# Patient Record
Sex: Male | Born: 1950 | Race: White | Hispanic: No | Marital: Married
Health system: Midwestern US, Academic
[De-identification: ages and names within clinical notes are randomized; demographics above are authoritative.]

---

## 2021-05-12 ENCOUNTER — Encounter: Admit: 2021-05-12 | Discharge: 2021-05-12 | Payer: MEDICARE

## 2021-05-12 NOTE — Telephone Encounter
05-12-2021 Per Task Message, requests faxed to Dr Wilford Grist, (F) [phone], (P) [phone], and [facility], (F) [phone], (P) [phone], requested any Echo Images be Clouded or CD sent to [facility], [address], [address]      Please request records from PCP, Dr. Wilford Grist - Ph: [phone]; Fax: [phone] and cardiac testing from [facility] - Ph: [phone].

## 2021-05-13 ENCOUNTER — Encounter: Admit: 2021-05-13 | Discharge: 2021-05-13 | Payer: MEDICARE

## 2021-05-18 ENCOUNTER — Encounter: Admit: 2021-05-18 | Discharge: 2021-05-18 | Payer: MEDICARE

## 2021-05-19 ENCOUNTER — Encounter: Admit: 2021-05-19 | Discharge: 2021-05-19 | Payer: MEDICARE

## 2021-05-19 ENCOUNTER — Ambulatory Visit: Admit: 2021-05-19 | Discharge: 2021-05-20 | Payer: MEDICARE

## 2021-05-19 DIAGNOSIS — E7800 Pure hypercholesterolemia, unspecified: Secondary | ICD-10-CM

## 2021-05-19 DIAGNOSIS — E079 Disorder of thyroid, unspecified: Secondary | ICD-10-CM

## 2021-05-19 DIAGNOSIS — I351 Nonrheumatic aortic (valve) insufficiency: Secondary | ICD-10-CM

## 2021-05-19 DIAGNOSIS — K219 Gastro-esophageal reflux disease without esophagitis: Secondary | ICD-10-CM

## 2021-05-19 DIAGNOSIS — R011 Cardiac murmur, unspecified: Secondary | ICD-10-CM

## 2021-05-19 DIAGNOSIS — B999 Unspecified infectious disease: Secondary | ICD-10-CM

## 2021-05-19 DIAGNOSIS — G4730 Sleep apnea, unspecified: Secondary | ICD-10-CM

## 2021-05-19 DIAGNOSIS — M1990 Unspecified osteoarthritis, unspecified site: Secondary | ICD-10-CM

## 2021-05-19 DIAGNOSIS — I10 Essential (primary) hypertension: Secondary | ICD-10-CM

## 2021-05-19 DIAGNOSIS — D539 Nutritional anemia, unspecified: Secondary | ICD-10-CM

## 2021-05-19 DIAGNOSIS — E785 Hyperlipidemia, unspecified: Secondary | ICD-10-CM

## 2021-05-19 NOTE — Progress Notes
Date of Service: 05/19/2021    Justin Chang is a 71 y.o. male.       HPI     Patient is a 71 year old Caucasian male past medical history of obesity, Barrett's esophagus, BPH, obstructive sleep apnea on chronic CPAP therapy who was having issues with shortness of breath last fall and increased activity intolerance which led to a nuclear cardiac stress test that was normal.  In follow-up with his primary physician this spring a soft murmur was identified.  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  The report notes that the valve is tricuspid with no findings for stenosis, there is mild dilatation of the ascending aorta.  The pressure half-time at the aortic valve was greater than 500 ms.  I do not see report specific to a vena contractor measurement of the aortic valve regurgitation or mention as far as reversal of flow identified in the aorta.  Patient denies significant chest pressures and overall shortness of breath has been stable.  Reports he will get somewhat lightheaded periodically but nothing has been problematic or unexpected and no syncope.         Vitals:    05/19/21 1039   BP: 120/76   BP Source: Arm, Left Upper   Pulse: 80   SpO2: 95%   O2 Percent: 95 %   O2 Device: None (Room air)   PainSc: Zero   Weight: 90.2 kg (198 lb 12.8 oz)   Height: 165.1 cm (5' 5)     Body mass index is 33.08 kg/m?Marland Kitchen     Past Medical History  Patient Active Problem List    Diagnosis Date Noted   ? Anemia 05/18/2021   ? Barrett's esophagus determined by endoscopy 05/18/2021   ? BPH (benign prostatic hyperplasia) 05/18/2021   ? Gastric reflux 05/18/2021   ? Hyperlipidemia 05/18/2021   ? Hypertension 05/18/2021   ? Chronic obstructive pulmonary disease (HCC) 05/18/2021   ? Anxiety disorder 05/18/2021   ? Under observation for suspected coronary artery disease 05/18/2021     04/18/21 Echo:  Normal LV systolic and diastolic function.  Moderate aortic valve insufficiency.  Mildly dilated ascending aorta.    12/02/20  Stress MPI:  Non-diagnostic pharmacological stress ECG.  Normal LV systolic function.  Normal perfusion without evidence of ischemia.  Low risk Study  .  10/18/20 Coronary calcium score:  Total 24.7, Lt main 4.3, LAD 0, Lt Circ 0, RCA 20.4,            Review of Systems   Constitutional: Positive for malaise/fatigue.   HENT: Negative.    Eyes: Negative.    Cardiovascular: Positive for dyspnea on exertion.   Respiratory: Positive for shortness of breath.    Endocrine: Negative.    Hematologic/Lymphatic: Negative.    Skin: Negative.    Musculoskeletal: Positive for back pain, myalgias and neck pain.   Gastrointestinal: Negative.    Genitourinary: Negative.    Neurological: Positive for dizziness, headaches, light-headedness and weakness.   Psychiatric/Behavioral: Negative.    Allergic/Immunologic: Negative.        Physical Exam  Awake and alert, pleasant gentleman, large neck circumference with loss of landmarks and centrally obese.  Accompanied by his wife  Pupils are equal react without scleral injection  Neck is supple with normal carotid upstroke and no obvious masses.  Difficult to determine landmarks.  No obvious jugular venous distention or variable CV waves  Lungs are clear to  auscultation with good effort and symmetric rise and fall the chest  Heart S1, S2 that are normal.  I can create some S2 splitting with respiration, but do not appreciate significant murmur, click, or gallop  Abdomen is obese and protuberant, nontender.  Unable to appreciate the aortic impulse  Pulses are 2+ bilaterally at radial locations as well as at the pedal locations.  Normal refill with no pulsatile changes at the nailbeds  No peripheral edema with normal hair growth at the ankles and lower legs.  Symmetric muscle tone and skin turgor    Cardiovascular Studies  ECG from 2022 shows sinus rhythm with normal conduction repolarization  I reviewed nuclear stress test from December 2022 that showed preserved left ventricular ejection fraction and no ischemia  Echocardiogram performed in 2023 per described of above    Cardiovascular Health Factors  Vitals BP Readings from Last 3 Encounters:   05/19/21 120/76     Wt Readings from Last 3 Encounters:   05/19/21 90.2 kg (198 lb 12.8 oz)     BMI Readings from Last 3 Encounters:   05/19/21 33.08 kg/m?      Smoking Social History     Tobacco Use   Smoking Status Former   ? Types: Cigars   Smokeless Tobacco Former   ? Types: Chew   Tobacco Comments    Quit a long time ago      Lipid Profile No results found for: CHOL  No results found for: HDL  No results found for: LDL  No results found for: TRIG   Blood Sugar No results found for: HGBA1C  Glucose   Date Value Ref Range Status   09/15/2020 92  Final          Problems Addressed Today  Encounter Diagnoses   Name Primary?   ? Nonrheumatic aortic valve insufficiency Yes       Assessment and Plan     At this time patient is not having symptoms or for findings for significant aortic valve insufficiency.  They report identifies it is moderate.  With the available information of the pressure half-time of greater than 500 ms, he does not have widening of his pole fracture on the blood pressure measurements and preserved left ventricular ejection fraction with normal volume and size measurements I suspect it is likely more mild.  Based upon these findings would not order further testing at this time.  Would reassess in approximate 1 year to 18 months with repeat echocardiogram.  I discussed findings and my assessment and plan with the patient and his wife.  They are agreeable.         Current Medications (including today's revisions)  ? albuterol 0.083% (PROVENTIL) 2.5 mg /3 mL (0.083 %) nebulizer solution INHALE the contents of one vial in NEBULIZER EVERY 6 HOURS AS NEEDED for SHORTNESS OF BREATH or wheezing   ? aspirin EC 81 mg tablet Take one tablet by mouth daily. Take with food.   ? atorvastatin (LIPITOR) 20 mg tablet Take one tablet by mouth daily.   ? cyanocobalamin (vitamin B-12) (VITAMIN B-12) 1,000 mcg tablet Take one tablet by mouth daily.   ? cyclobenzaprine (FLEXERIL) 10 mg tablet Take one tablet by mouth three times daily as needed.   ? fluticasone propionate (FLONASE ALLERGY RELIEF) 50 mcg/actuation nasal spray, suspension Apply two sprays to each nostril as directed as Needed.   ? gabapentin (NEURONTIN) 300 mg capsule Take one capsule by mouth twice daily.   ? HYDROcodone/acetaminophen (  NORCO) 10/325 mg tablet Take one tablet by mouth every 6 hours as needed.   ? levothyroxine (SYNTHROID) 100 mcg tablet Take one tablet by mouth daily.   ? lisinopriL (ZESTRIL) 20 mg tablet Take one tablet by mouth daily.   ? meloxicam (MOBIC) 15 mg tablet Take one tablet by mouth daily.   ? omeprazole DR (PRILOSEC) 20 mg capsule Take one capsule by mouth daily.   ? tamsulosin (FLOMAX) 0.4 mg capsule Take one capsule by mouth twice daily.

## 2021-05-19 NOTE — Patient Instructions
No unstable findings for your heart.  The aortic valve is formed normally and the valve regurgitation is not causing changes to your heart function  Will arrange to repeat the echo in 12-18 months to ensure the valve is stable.

## 2022-01-31 ENCOUNTER — Encounter: Admit: 2022-01-31 | Discharge: 2022-01-31 | Payer: MEDICARE

## 2022-05-10 ENCOUNTER — Encounter: Admit: 2022-05-10 | Discharge: 2022-05-10 | Payer: MEDICARE

## 2022-05-10 ENCOUNTER — Ambulatory Visit: Admit: 2022-05-10 | Discharge: 2022-05-10 | Payer: MEDICARE

## 2022-05-10 DIAGNOSIS — Z952 Presence of prosthetic heart valve: Secondary | ICD-10-CM

## 2022-05-15 ENCOUNTER — Encounter: Admit: 2022-05-15 | Discharge: 2022-05-15 | Payer: MEDICARE

## 2022-05-15 NOTE — Telephone Encounter
-----   Message from Agapito Games, RN sent at 05/15/2022  9:10 AM CDT -----    ----- Message -----  From: Levora Angel, MD  Sent: 05/15/2022   9:05 AM CDT  To: [facility] Nurse [facility] Card Team [facility]    Please let him know that his echo looks very good.  The heart function continues to stay normal.  The aortic valve does show some leak but really nothing concerning or problematic.  Unless he is having symptoms does not need to follow-up for least another 6 to 12 months.  ----- Message -----  From: Glynda Jaeger, MD  Sent: 05/10/2022   2:12 PM CDT  To: Levora Angel, MD

## 2022-05-16 ENCOUNTER — Encounter: Admit: 2022-05-16 | Discharge: 2022-05-16 | Payer: MEDICARE

## 2022-06-12 ENCOUNTER — Encounter: Admit: 2022-06-12 | Discharge: 2022-06-12 | Payer: MEDICARE

## 2022-07-13 ENCOUNTER — Encounter: Admit: 2022-07-13 | Discharge: 2022-07-13 | Payer: MEDICARE

## 2022-07-18 ENCOUNTER — Encounter: Admit: 2022-07-18 | Discharge: 2022-07-18 | Payer: MEDICARE

## 2022-07-18 DIAGNOSIS — E785 Hyperlipidemia, unspecified: Secondary | ICD-10-CM

## 2022-07-18 DIAGNOSIS — G4730 Sleep apnea, unspecified: Secondary | ICD-10-CM

## 2022-07-18 DIAGNOSIS — Z136 Encounter for screening for cardiovascular disorders: Secondary | ICD-10-CM

## 2022-07-18 DIAGNOSIS — I351 Nonrheumatic aortic (valve) insufficiency: Secondary | ICD-10-CM

## 2022-07-18 DIAGNOSIS — D539 Nutritional anemia, unspecified: Secondary | ICD-10-CM

## 2022-07-18 DIAGNOSIS — I10 Essential (primary) hypertension: Secondary | ICD-10-CM

## 2022-07-18 DIAGNOSIS — K219 Gastro-esophageal reflux disease without esophagitis: Secondary | ICD-10-CM

## 2022-07-18 DIAGNOSIS — M1990 Unspecified osteoarthritis, unspecified site: Secondary | ICD-10-CM

## 2022-07-18 DIAGNOSIS — E079 Disorder of thyroid, unspecified: Secondary | ICD-10-CM

## 2022-07-18 DIAGNOSIS — R011 Cardiac murmur, unspecified: Secondary | ICD-10-CM

## 2022-07-18 DIAGNOSIS — B999 Unspecified infectious disease: Secondary | ICD-10-CM

## 2022-07-18 DIAGNOSIS — E7800 Pure hypercholesterolemia, unspecified: Secondary | ICD-10-CM

## 2022-07-18 NOTE — Progress Notes
Date of Service: 07/18/2022    Justin Chang is a 72 y.o. male.       HPI   Mr. Verdell has been followed for hypertension, hypercholesterolemia, obstructive sleep apnea on CPAP and chronic dyspnea with exertion.  He reports that he has been stable over the past year without any worsening of his chronic dyspnea with exertion.  He believes that he can walk 1/2-3/4 of a mile at a time without difficulty.  He reports no recent emergency room visits or hospitalizations. The patient believes that he has been stable and reports no angina, congestive symptoms, palpitations, sensation of sustained forceful heart pounding, lightheadedness or syncope.  His exercise tolerance has been stable, although he does not have a regular exercise program.  He believes that his weight has been stable over the past year.  The patient reports no myalgias, claudication, bleeding abnormalities, or strokelike symptoms.  He believes that his blood pressure is generally well-controlled when checked outside the office and works with his primary care for control of his blood pressure and lipid profile.       Vitals:    07/18/22 0944   BP: (!) 153/90   BP Source: Arm, Left Upper   Pulse: 68   SpO2: 96%   O2 Device: None (Room air)   PainSc: Four   Weight: 92.7 kg (204 lb 6.4 oz)   Height: 167.6 cm (5' 6)     Body mass index is 32.99 kg/m?Marland Kitchen     Past Medical History  Patient Active Problem List    Diagnosis Date Noted    Anemia 05/18/2021    Barrett's esophagus determined by endoscopy 05/18/2021    BPH (benign prostatic hyperplasia) 05/18/2021    Gastric reflux 05/18/2021    Hyperlipidemia 05/18/2021    Hypertension 05/18/2021    Chronic obstructive pulmonary disease (HCC) 05/18/2021    Anxiety disorder 05/18/2021    Under observation for suspected coronary artery disease 05/18/2021     04/18/21 Echo:  Normal LV systolic and diastolic function.  Moderate aortic valve insufficiency.  Mildly dilated ascending aorta.    12/02/20  Stress MPI: Non-diagnostic pharmacological stress ECG.  Normal LV systolic function.  Normal perfusion without evidence of ischemia.  Low risk Study  .  10/18/20 Coronary calcium score:  Total 24.7, Lt main 4.3, LAD 0, Lt Circ 0, RCA 20.4,            Review of Systems   Constitutional: Negative.   HENT: Negative.     Eyes: Negative.    Cardiovascular: Negative.    Respiratory: Negative.     Endocrine: Negative.    Hematologic/Lymphatic: Negative.    Skin: Negative.    Musculoskeletal: Negative.    Gastrointestinal: Negative.    Genitourinary: Negative.    Neurological: Negative.    Psychiatric/Behavioral: Negative.     Allergic/Immunologic: Negative.        Physical Exam  GENERAL: The patient is well developed, well nourished, resting comfortably and in no distress.   HEENT: No abnormalities of the visible oro-nasopharynx, conjunctiva or sclera are noted.  NECK: There is no jugular venous distension. Carotids are palpable and without bruits. There is no thyroid enlargement.  Chest: Lung fields are clear to auscultation. There are no wheezes or crackles.  CV: There is a regular rhythm. The first and second heart sounds are normal.  A soft systolic ejection murmur is heard along with a soft blowing early diastolic murmur.  There are no gallops or rubs.  ABD: The abdomen is soft and supple with normal bowel sounds. There is no hepatosplenomegaly, ascites, tenderness, masses or bruits.  Neuro: There are no focal motor defects. Ambulation is normal. Cognitive function appears normal.  Ext: There is no edema or evidence of deep vein thrombosis. Peripheral pulses are satisfactory.    SKIN: There are no rashes and no cellulitis  PSYCH: The patient is calm, rationale and oriented.    Cardiovascular Studies  A twelve-lead ECG was obtained on 07/18/2022 reveals normal sinus rhythm with a heart rate of 62 bpm.  Incomplete right bundle branch block is noted.  Echo Doppler 05/10/2022:  Interpretation Summary  Show Result Comparison     The left ventricular size, wall thickness and systolic function are normal. The ejection fraction by Simpson's biplane method is 61%. There are no segmental wall motion abnormalities.    The right ventricular size is normal. The right ventricular systolic function is normal.    Left Atrium: Mildly dilated.    Aortic Valve: The valve has focal thickening. Tricuspid. No stenosis. Mild to moderate regurgitation.    Estimated Peak Systolic PA Pressure 28 mmHg    No pericardial effusion.     See remainder of report for additional findings.        Echocardiographic Findings    Left Ventricle The left ventricular size, wall thickness and systolic function are normal. The ejection fraction by Simpson's biplane method is 61%. There are no segmental wall motion abnormalities. Grade I (mild) left ventricular diastolic dysfunction. Normal left atrial pressure.   Right Ventricle The right ventricular size is normal. The right ventricular systolic function is normal.   Left Atrium Mildly dilated.   Right Atrium Normal size.   IVC/SVC Normal central venous pressure (0-5 mm Hg).   Mitral Valve Non-specific thickening. No stenosis. No regurgitation.   Tricuspid Valve Normal valve structure. No stenosis. Trace regurgitation.   Aortic Valve The valve has focal thickening. Tricuspid.  No stenosis. Mild to moderate regurgitation.   Pulmonary The pulmonic valve was not seen well but no Doppler evidence of stenosis. Trace regurgitation.   Aorta The aortic root and ascending aorta are normal in size for gender/BSA.   Pericardium Pericardial fat pad present. No pericardial effusion.     Cardiovascular Health Factors  Vitals BP Readings from Last 3 Encounters:   07/18/22 (!) 153/90   05/10/22 122/60   05/19/21 120/76     Wt Readings from Last 3 Encounters:   07/18/22 92.7 kg (204 lb 6.4 oz)   05/10/22 93 kg (205 lb 0.4 oz)   05/19/21 90.2 kg (198 lb 12.8 oz)     BMI Readings from Last 3 Encounters:   07/18/22 32.99 kg/m?   05/10/22 33.09 kg/m? 05/19/21 33.08 kg/m?      Smoking Social History     Tobacco Use   Smoking Status Former    Types: Cigars   Smokeless Tobacco Former    Types: Chew   Tobacco Comments    Quit a long time ago      Lipid Profile No results found for: CHOL  No results found for: HDL  No results found for: LDL  No results found for: TRIG   Blood Sugar No results found for: HGBA1C  Glucose   Date Value Ref Range Status   09/15/2020 92  Final          Problems Addressed Today  Encounter Diagnoses   Name Primary?    Screening for heart disease Yes  Nonrheumatic aortic valve insufficiency        Assessment and Plan   Mr. Blackwelder reports no angina or congestive symptoms.  I have asked him to work closely with his primary care physician for control of his blood pressure and lipid profile.  I reviewed his most recent echo Doppler study with him.  Cardiovascular risk factor management was discussed in great detail. Regular mild aerobic exercise, weight loss and adherence to a heart healthy diet were recommended.  I have asked him to return for follow-up in approximately 1 years time. The total time spent during this interview and exam with preparation and chart review was 30 minutes.         Current Medications (including today's revisions)   albuterol 0.083% (PROVENTIL) 2.5 mg /3 mL (0.083 %) nebulizer solution INHALE the contents of one vial in NEBULIZER EVERY 6 HOURS AS NEEDED for SHORTNESS OF BREATH or wheezing    aspirin EC 81 mg tablet Take one tablet by mouth daily. Take with food.    atorvastatin (LIPITOR) 20 mg tablet Take one tablet by mouth daily.    cyanocobalamin (vitamin B-12) (VITAMIN B-12) 1,000 mcg tablet Take one tablet by mouth daily.    cyclobenzaprine (FLEXERIL) 10 mg tablet Take one tablet by mouth three times daily as needed.    fluticasone propionate (FLONASE ALLERGY RELIEF) 50 mcg/actuation nasal spray, suspension Apply two sprays to each nostril as directed as Needed.    gabapentin (NEURONTIN) 300 mg capsule Take one capsule by mouth twice daily.    HYDROcodone/acetaminophen (NORCO) 10/325 mg tablet Take one tablet by mouth every 6 hours as needed.    levothyroxine (SYNTHROID) 100 mcg tablet Take one tablet by mouth daily.    lisinopriL (ZESTRIL) 20 mg tablet Take one tablet by mouth daily.    meloxicam (MOBIC) 15 mg tablet Take one tablet by mouth daily.    omeprazole DR (PRILOSEC) 20 mg capsule Take one capsule by mouth daily.    tamsulosin (FLOMAX) 0.4 mg capsule Take one capsule by mouth twice daily.

## 2022-08-02 IMAGING — CT UROGRAM(Adult)
2 of 10 series · 10 of 46 positions shown, 11 images · non-contrast
Comparison: none

[Series 6: kidney 1min cor 3.00 br40 s3 · coronal · 0.24mm/px · 2 of 116 slices shown]
[im 39/116  soft-tissue]
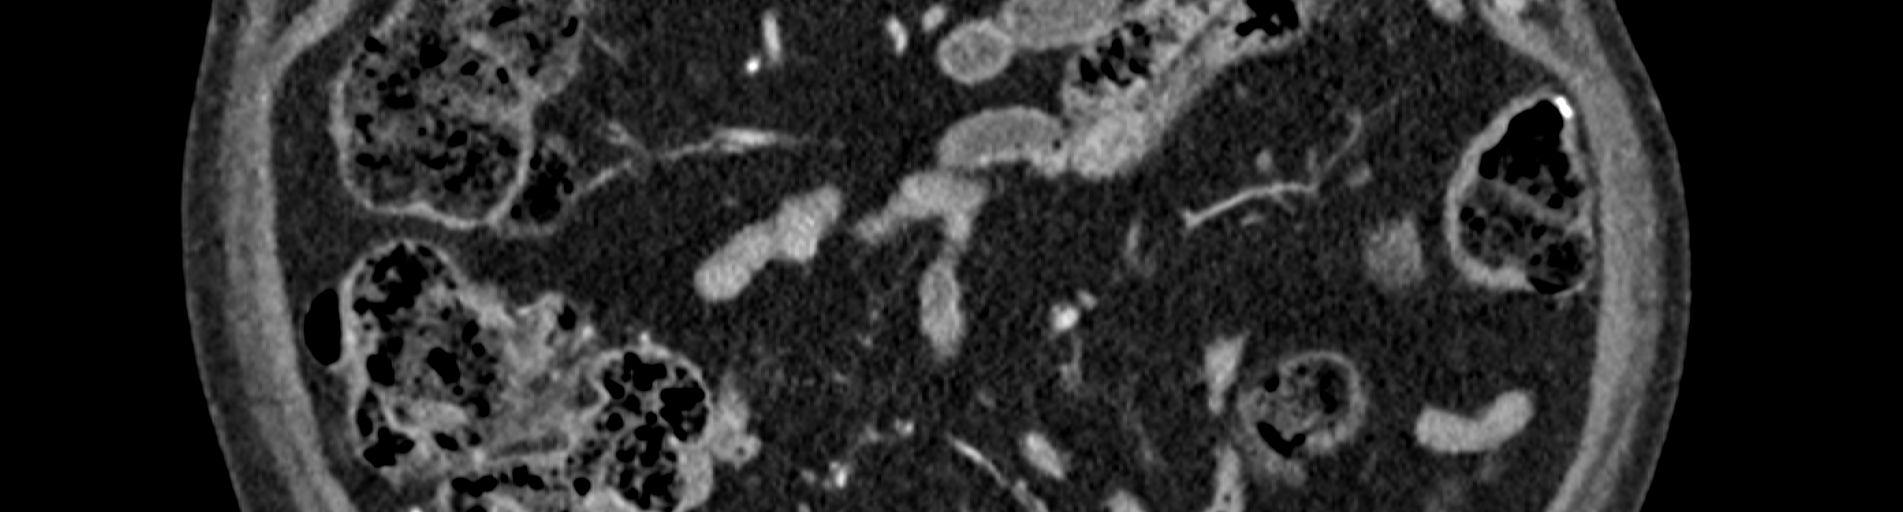
[im 77/116  soft-tissue]
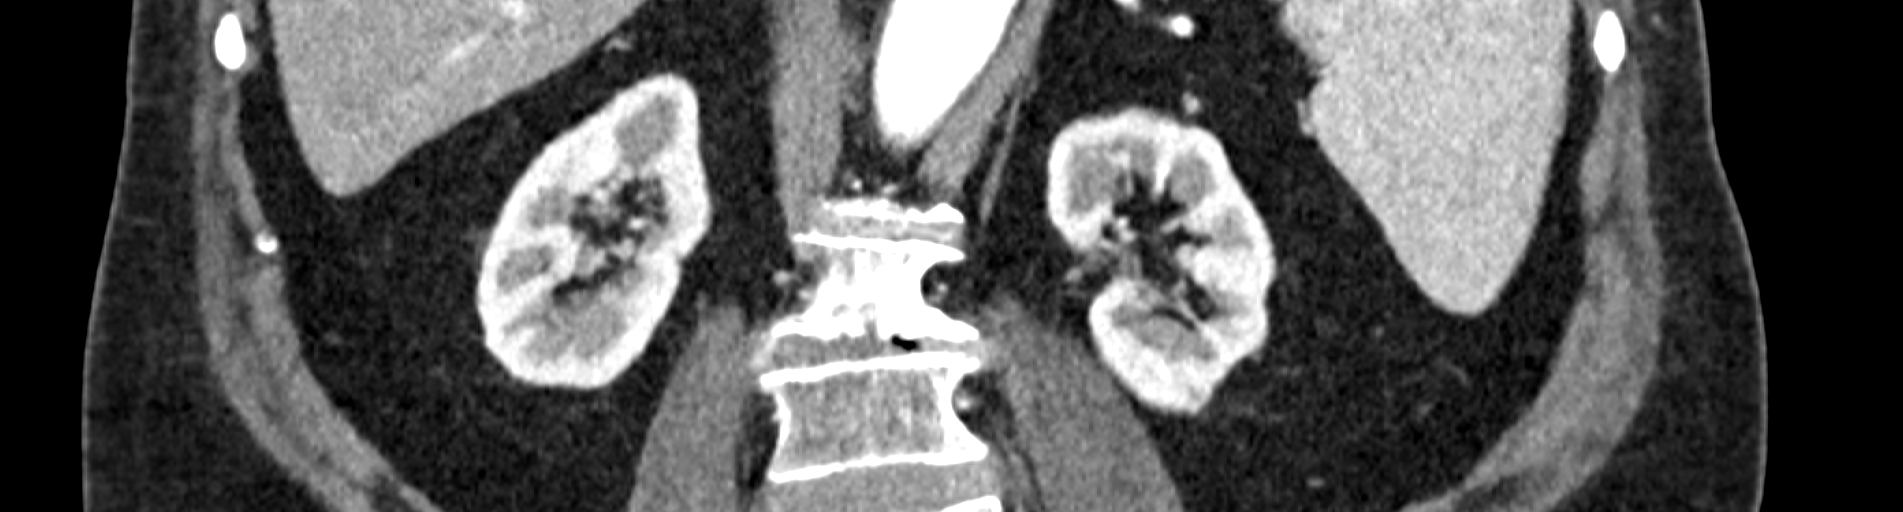

[Series 16: abd pel delay ax 3.00 br40 s3 · axial · delayed · 0.67mm/px · z∈[+1306,+1699]mm · 8 of 151 slices shown, 9 images]
[im 10/151  soft-tissue]
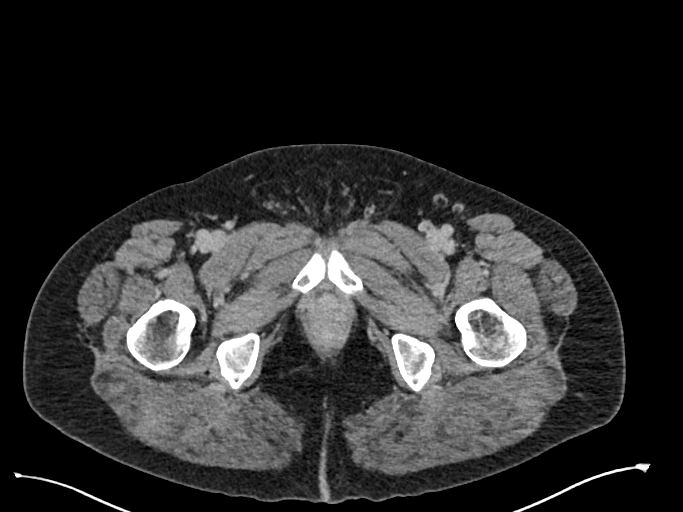
[im 10/151  bone]
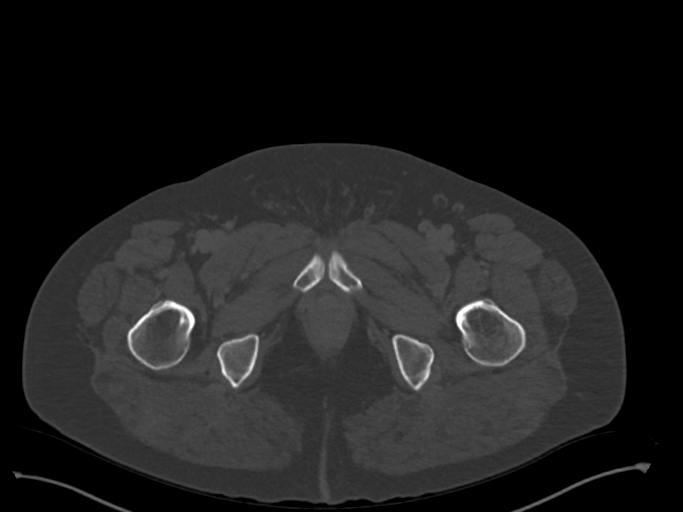
[im 29/151  soft-tissue]
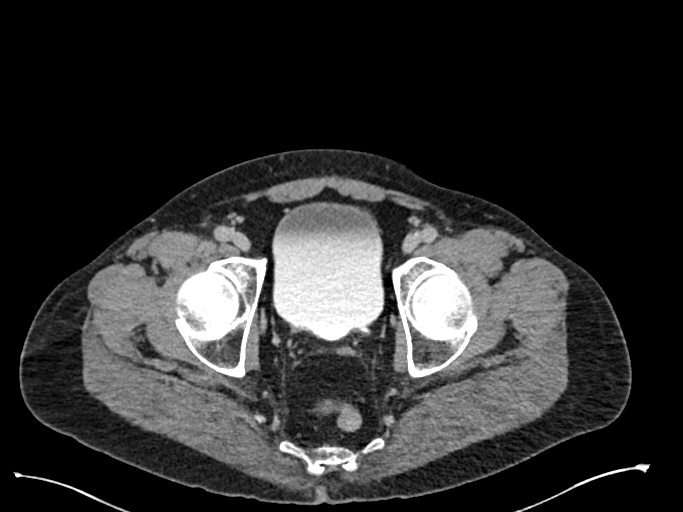
[im 47/151  soft-tissue]
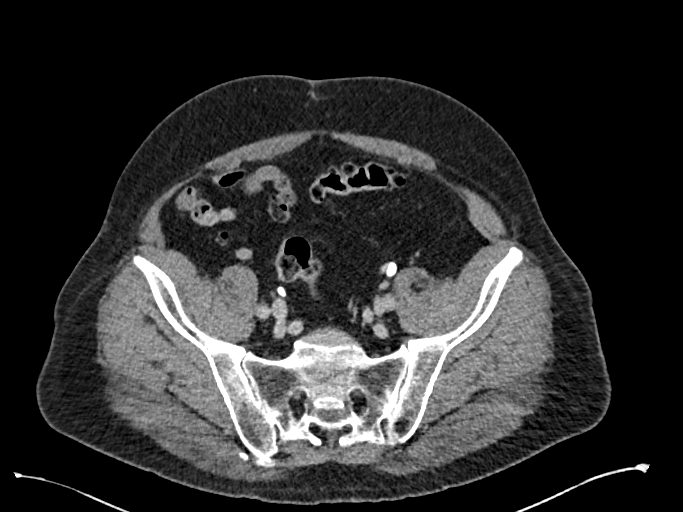
[im 66/151  soft-tissue]
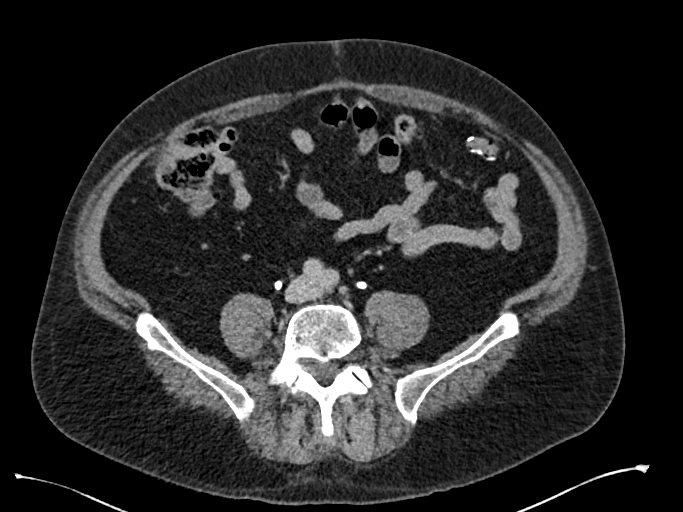
[im 85/151  soft-tissue]
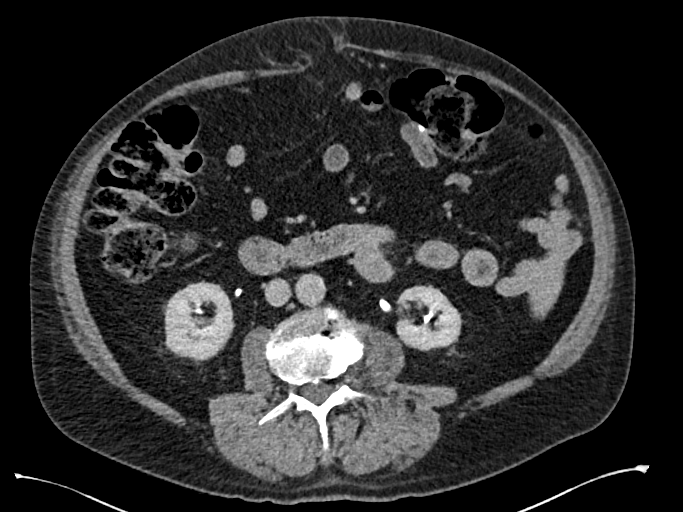
[im 104/151  soft-tissue]
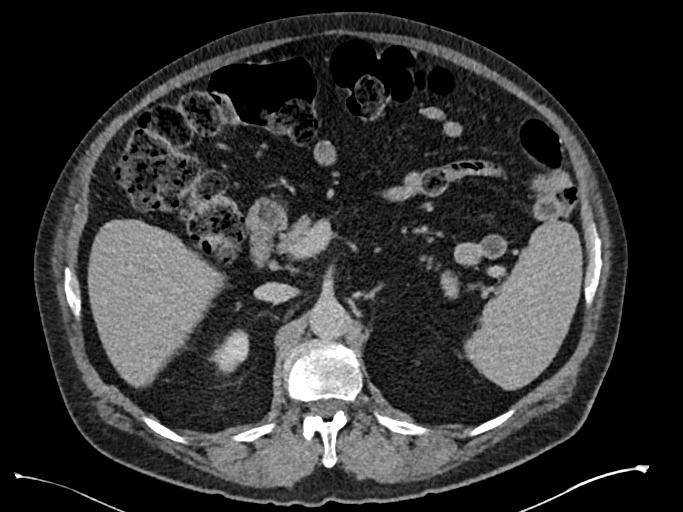
[im 122/151  soft-tissue]
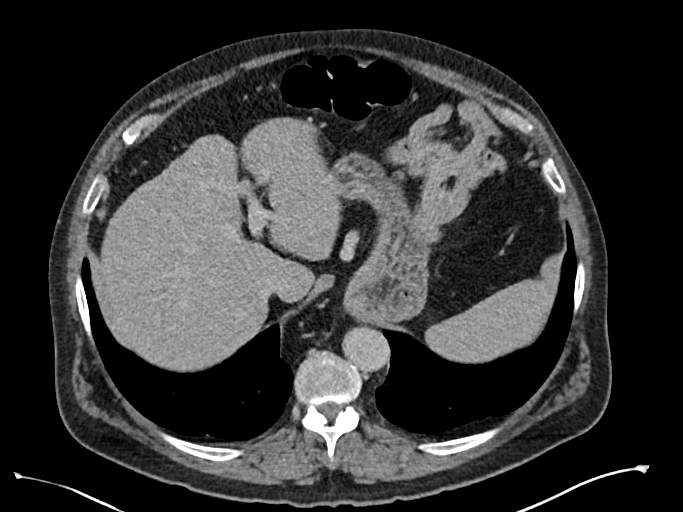
[im 141/151  soft-tissue]
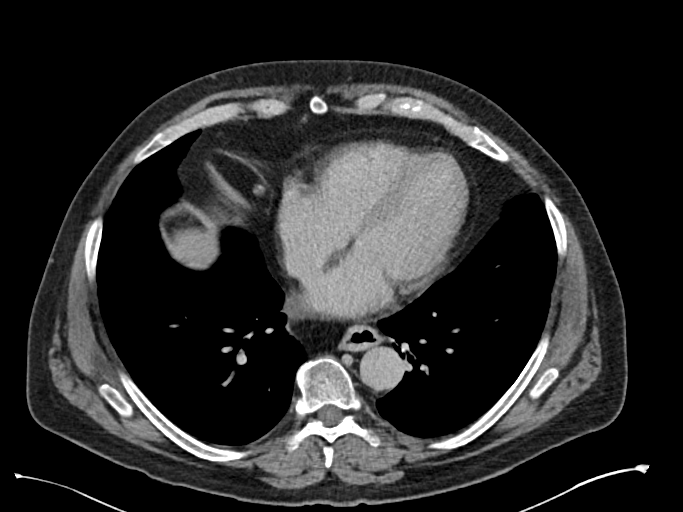

[10 of 46 positions shown; findings below may reference images not displayed]

DIAGNOSTIC STUDIES

EXAM

CT urogram

INDICATION

Hematuria
PT STATES HEMATURIA. GFR 62. FWWTX [id number]. CT/NM [DATE]. TJ

TECHNIQUE

All CT scans at this facility use dose modulation, iterative reconstruction, and/or weight based
dosing when appropriate to reduce radiation dose to as low as reasonably achievable.

Number of previous computed tomography exams in the last 12 months is 1.

Number of previous nuclear medicine myocardial perfusion studies in the last 12 months is 0  .

COMPARISONS

August 02, 2017.

FINDINGS

Lung bases are clear with mild pleural lipomatosis. Liver is unremarkable with a small granuloma in
the left hepatic lobe. No calcified gallstones are seen. Spleen is normal.

Adrenal glands are unremarkable. No renal calculi are seen. No renal mass lesions are evident. No
hydronephrosis is evident.

There is pancreatic atrophy. No retroperitoneal adenopathy. Calcified plaque is seen throughout the
abdominal pelvic vasculature.

Small and large bowel are normal in caliber. Prior anastomosis is seen left anterior abdomen. There
is mild colonic diverticulosis without diverticulitis. No ascites or mesenteric adenopathy.

No filling defects are seen in the bladder. No pelvic adenopathy is seen. Degenerative changes of
the spine are noted.

IMPRESSION

No renal calculi, hydronephrosis, or renal mass lesions. No filling defects are seen in the bladder.

Additional findings as above.

Tech Notes:

PT STATES HEMATURIA. GFR 62. FWWTX [id number]. CT/NM [DATE]. TJ

## 2022-09-28 ENCOUNTER — Encounter: Admit: 2022-09-28 | Discharge: 2022-09-28 | Payer: MEDICARE

## 2023-09-10 ENCOUNTER — Encounter: Admit: 2023-09-10 | Discharge: 2023-09-10 | Payer: MEDICARE

## 2023-09-20 ENCOUNTER — Ambulatory Visit: Admit: 2023-09-20 | Discharge: 2023-09-20 | Payer: MEDICARE

## 2023-09-20 ENCOUNTER — Encounter: Admit: 2023-09-20 | Discharge: 2023-09-20 | Payer: MEDICARE

## 2023-09-20 VITALS — BP 146/85 | HR 84 | Ht 65.0 in | Wt 200.2 lb

## 2023-09-20 DIAGNOSIS — Z136 Encounter for screening for cardiovascular disorders: Principal | ICD-10-CM

## 2023-09-20 NOTE — Progress Notes
 Date of Service: 09/20/2023    Justin Chang is a 73 y.o. male.       HPI   Justin Chang has been followed for hypertension, hypercholesterolemia, obstructive sleep apnea on CPAP and chronic dyspnea with exertion.  The patient reports that he did undergo ventral hernia repair with mesh in November 2024 with good results.  He reports that he has been stable over the past year without any worsening of his chronic dyspnea with exertion. He reports no recent emergency room visits or unexpected hospitalizations. The patient believes that he has been stable and reports no angina, congestive symptoms, palpitations, sensation of sustained forceful heart pounding, lightheadedness or syncope.  His exercise tolerance has been stable, although he does not have a regular exercise program.  He states that his back keeps him from walking but I believe he might do well with a water aerobics program.  He believes that his weight has been stable over the past year.  The patient reports no myalgias, claudication, bleeding abnormalities, claudication or strokelike symptoms.  He believes that his blood pressure is generally well-controlled when checked outside the office and works with his primary care for control of his blood pressure and lipid profile. 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       Vitals:    09/20/23 1253   BP: (!) 146/85   BP Source: Arm, Left Upper   Pulse: 84   SpO2: 95%   O2 Device: None (Room air)   PainSc: Eight   Weight: 90.8 kg (200 lb 3.2 oz)   Height: 165.1 cm (5' 5)     Body mass index is 33.32 kg/m?SABRA     Past Medical History  Patient Active Problem List    Diagnosis Date Noted    Anemia 05/18/2021    Barrett's esophagus determined by endoscopy 05/18/2021    BPH (benign prostatic hyperplasia) 05/18/2021    Gastric reflux 05/18/2021    Hyperlipidemia 05/18/2021    Hypertension 05/18/2021    Chronic obstructive pulmonary disease (CMS-HCC) 05/18/2021    Anxiety disorder 05/18/2021    Under observation for suspected coronary artery disease 05/18/2021     04/18/21 Echo:  Normal LV systolic and diastolic function.  Moderate aortic valve insufficiency.  Mildly dilated ascending aorta.    12/02/20  Stress MPI:  Non-diagnostic pharmacological stress ECG.  Normal LV systolic function.  Normal perfusion without evidence of ischemia.  Low risk Study  .  10/18/20 Coronary calcium score:  Total 24.7, Lt main 4.3, LAD 0, Lt Circ 0, RCA 20.4,            Review of Systems   Constitutional: Negative.   HENT: Negative.     Eyes: Negative.    Cardiovascular:  Positive for dyspnea on exertion.   Endocrine: Negative.    Hematologic/Lymphatic: Negative.    Skin: Negative.    Musculoskeletal:  Positive for back pain.   Gastrointestinal: Negative.    Genitourinary: Negative.    Neurological: Negative.    Psychiatric/Behavioral: Negative.     Allergic/Immunologic: Negative.        Physical Exam  GENERAL: The patient is well developed, well nourished, resting comfortably and in no distress.   HEENT: No abnormalities of the visible oro-nasopharynx, conjunctiva or sclera are noted.  NECK: There is no jugular venous distension. Carotids are palpable and without bruits. There is no thyroid enlargement.  Chest: Lung fields are clear to auscultation. There are  no wheezes or crackles.  CV: There is a regular rhythm. The first and second heart sounds are normal.  A soft systolic ejection murmur is heard along with a soft blowing early diastolic murmur.  There are no gallops or rubs.  ABD: The abdomen is soft and supple with normal bowel sounds. There is no hepatosplenomegaly, ascites, tenderness, masses or bruits.  His ventral hernia repair site looks good.  Neuro: There are no focal motor defects. Ambulation is normal. Cognitive function appears normal.  Ext: There is no edema or evidence of deep vein thrombosis. Peripheral pulses are satisfactory.    SKIN: There are no rashes and no cellulitis  PSYCH: The patient is calm, rationale and oriented.    Cardiovascular Studies  A twelve-lead ECG obtained on 09/20/2023 reveals normal sinus rhythm with a heart rate of 78 bpm.  There is no evidence of myocardial ischemia or infarction.  Echo Doppler 05/10/2022:  Interpretation Summary      The left ventricular size, wall thickness and systolic function are normal. The ejection fraction by Simpson's biplane method is 61%. There are no segmental wall motion abnormalities.    The right ventricular size is normal. The right ventricular systolic function is normal.    Left Atrium: Mildly dilated.    Aortic Valve: The valve has focal thickening. Tricuspid. No stenosis. Mild to moderate regurgitation.    Estimated Peak Systolic PA Pressure 28 mmHg    No pericardial effusion.     See remainder of report for additional findings.        Echocardiographic Findings     Left Ventricle The left ventricular size, wall thickness and systolic function are normal. The ejection fraction by Simpson's biplane method is 61%. There are no segmental wall motion abnormalities. Grade I (mild) left ventricular diastolic dysfunction. Normal left atrial pressure.   Right Ventricle The right ventricular size is normal. The right ventricular systolic function is normal.   Left Atrium Mildly dilated.   Right Atrium Normal size.   IVC/SVC Normal central venous pressure (0-5 mm Hg).   Mitral Valve Non-specific thickening. No stenosis. No regurgitation.   Tricuspid Valve Normal valve structure. No stenosis. Trace regurgitation.   Aortic Valve The valve has focal thickening. Tricuspid.  No stenosis. Mild to moderate regurgitation.   Pulmonary The pulmonic valve was not seen well but no Doppler evidence of stenosis. Trace regurgitation.   Aorta The aortic root and ascending aorta are normal in size for gender/BSA.   Pericardium Pericardial fat pad present. No pericardial effusion.     Cardiovascular Health Factors  Vitals BP Readings from Last 3 Encounters:   09/20/23 (!) 146/85   07/18/22 (!) 153/90   05/10/22 122/60     Wt Readings from Last 3 Encounters:   09/20/23 90.8 kg (200 lb 3.2 oz)   07/18/22 92.7 kg (204 lb 6.4 oz)   05/10/22 93 kg (205 lb 0.4 oz)     BMI Readings from Last 3 Encounters:   09/20/23 33.32 kg/m?   07/18/22 32.99 kg/m?   05/10/22 33.09 kg/m?      Smoking Tobacco Use History[1]   Lipid Profile Cholesterol   Date Value Ref Range Status   09/25/2022 181  Final     HDL   Date Value Ref Range Status   09/25/2022 49  Final     LDL   Date Value Ref Range Status   09/25/2022 87  Final     Triglycerides   Date Value Ref Range Status  09/25/2022 228 (H) <150 Final      Blood Sugar No results found for: HGBA1C  Glucose   Date Value Ref Range Status   12/02/2022 106 (H) 70 - 105 Final   09/15/2020 92  Final          Problems Addressed Today  Encounter Diagnoses   Name Primary?    Screening for heart disease Yes       Assessment and Plan   Mr. Collier reports no angina or congestive symptoms.  His blood pressure is mildly elevated today but he believes that his blood pressure is well-controlled when checked outside of a office.  I have asked him to work closely with his primary care physician for control of his blood pressure and lipid profile.  Cardiovascular risk factor management was discussed in great detail. Regular mild aerobic exercise, weight loss and adherence to a heart healthy diet were recommended.  I strongly encouraged him to consider a water aerobics program for exercise since he believes other forms of exercise cause back discomfort.  To my knowledge he has had no coronary or cerebrovascular events or interventions and does not need to take aspirin for any cardiovascular reasons.  I have asked him to return for follow-up in approximately 1 years time. The total time spent during this interview and exam with preparation and chart review was 30 minutes.          Current Medications (including today's revisions)   albuterol 0.083% (PROVENTIL) 2.5 mg /3 mL (0.083 %) nebulizer solution Inhale 3 mL solution by nebulizer as directed as Needed.    aspirin EC 81 mg tablet Take one tablet by mouth daily. Take with food.    atorvastatin (LIPITOR) 20 mg tablet Take one tablet by mouth daily.    cyanocobalamin (vitamin B-12) 500 mcg tablet Take one tablet by mouth daily.    cyclobenzaprine (FLEXERIL) 10 mg tablet Take one tablet by mouth three times daily as needed.    fluticasone propionate (FLONASE ALLERGY RELIEF) 50 mcg/actuation nasal spray, suspension Apply two sprays to each nostril as directed as Needed.    gabapentin (NEURONTIN) 300 mg capsule Take one capsule by mouth twice daily.    HYDROcodone/acetaminophen (NORCO) 10/325 mg tablet Take one tablet by mouth every 6 hours as needed.    levothyroxine (SYNTHROID) 100 mcg tablet Take one tablet by mouth daily.    lisinopriL (ZESTRIL) 20 mg tablet Take one tablet by mouth daily.    meloxicam (MOBIC) 15 mg tablet Take one tablet by mouth daily.    omeprazole DR (PRILOSEC) 20 mg capsule Take one capsule by mouth daily.    tamsulosin (FLOMAX) 0.4 mg capsule Take one capsule by mouth twice daily.                 [1]   Social History  Tobacco Use   Smoking Status Former    Types: Cigars   Smokeless Tobacco Former    Types: Chew   Tobacco Comments    Quit a long time ago

## 2023-09-20 NOTE — Patient Instructions
Thank you for visiting our office today.    We would like to make the following medication adjustments:  NONE       Otherwise continue the same medications as you have been doing.          We will be pursuing the following tests after your appointment today:       Orders Placed This Encounter    ECG 12-LEAD         We will plan to see you back in 12 months.  Please call us in the meantime with any questions or concerns.        Please allow 5-7 business days for our providers to review your results. All normal results will go to MyChart. If you do not have Mychart, it is strongly recommended to get this so you can easily view all your results. If you do not have mychart, we will attempt to call you once with normal lab and testing results. If we cannot reach you by phone with normal results, we will send you a letter.  If you have not heard the results of your testing after one week please give us a call.       Your Cardiovascular [facility] [place] Team (Steve, Lisa, Jamie, Melanie, and Devaunte Gasparini)  phone number is [phone].
